# Patient Record
Sex: Female | Born: 1990 | Race: White | Hispanic: No | Marital: Single | Smoking: Never smoker
Health system: Southern US, Community
[De-identification: ages and names within clinical notes are randomized; demographics above are authoritative.]

## PROBLEM LIST (undated history)

## (undated) ENCOUNTER — Inpatient Hospital Stay (HOSPITAL_COMMUNITY): Payer: Self-pay

## (undated) DIAGNOSIS — K219 Gastro-esophageal reflux disease without esophagitis: Secondary | ICD-10-CM

## (undated) DIAGNOSIS — J45909 Unspecified asthma, uncomplicated: Secondary | ICD-10-CM

## (undated) HISTORY — PX: OTHER SURGICAL HISTORY: SHX169

## (undated) HISTORY — PX: TONSILLECTOMY: SUR1361

## (undated) HISTORY — PX: WISDOM TOOTH EXTRACTION: SHX21

---

## 2015-09-29 ENCOUNTER — Encounter (HOSPITAL_COMMUNITY): Payer: Self-pay | Admitting: *Deleted

## 2015-09-29 ENCOUNTER — Inpatient Hospital Stay (HOSPITAL_COMMUNITY)
Admission: AD | Admit: 2015-09-29 | Discharge: 2015-09-29 | Disposition: A | Discharge status: Home / Self Care | Payer: Medicaid Other | Source: Ambulatory Visit | Attending: Obstetrics & Gynecology | Admitting: Obstetrics & Gynecology

## 2015-09-29 ENCOUNTER — Inpatient Hospital Stay (HOSPITAL_COMMUNITY): Payer: Medicaid Other

## 2015-09-29 DIAGNOSIS — R102 Pelvic and perineal pain: Secondary | ICD-10-CM | POA: Insufficient documentation

## 2015-09-29 DIAGNOSIS — O34219 Maternal care for unspecified type scar from previous cesarean delivery: Secondary | ICD-10-CM

## 2015-09-29 DIAGNOSIS — O26893 Other specified pregnancy related conditions, third trimester: Secondary | ICD-10-CM | POA: Diagnosis not present

## 2015-09-29 DIAGNOSIS — Z3A30 30 weeks gestation of pregnancy: Secondary | ICD-10-CM | POA: Insufficient documentation

## 2015-09-29 HISTORY — DX: Unspecified asthma, uncomplicated: J45.909

## 2015-09-29 HISTORY — DX: Gastro-esophageal reflux disease without esophagitis: K21.9

## 2015-09-29 NOTE — MAU Note (Signed)
Pt presents to MAU stating that she is suppose to meet Dr Despina HiddenEure here. PT states that she delivered her last baby three years ago in [place] by C/S. PT states at her last appointment that her physician told her that her C/S scar was thin and her needed her to be transferred to a high risk facility.

## 2015-09-29 NOTE — Progress Notes (Signed)
Pt is stating that Dr. Despina HiddenEure instructed her to come to MAU @ 1830 tonight so she could establish care with him.  Dr. Despina HiddenEure states that a family member of the patient contacted him last night, stated that her OB in [place] is uncomfortable with her situation & the pt's family member wanted to know if he would take her as a patient.  MD states he instructed the family member to have the patient fax her records to his office & he would see her.  MD is unaware that patient was coming to MAU today.

## 2015-09-29 NOTE — MAU Note (Signed)
Urine sent to lab 

## 2015-09-29 NOTE — MAU Provider Note (Addendum)
  History     CSN: [id number]  Arrival date and time: 09/29/15 [id number]   First Provider Initiated Contact with Patient 09/29/15 1943       HPI  [id number] Estimated Date of Delivery: 12/02/15 followed in [place]  Pt presents for evaluation of possible lower uterine segment thinning or a "window" in the lower uterine segment which was seen on sonogram 2 weeks ago at her local OB group in [place].  Subsequently pt was [facility] but was having difficulty with logisitics and appointments. She has been on bedrest since that sonogram and information for fear of uterine disruption.  She came in tonight because she was experiencing lower pelvic pain adn pressure.  No bleeding good fetal movement no ROM  Reactive NST and no contractions see on the monitor and pt began to feel better as well  Sonogram was performed and appears normal with no evidence of thinning or lower segment disruption, the uterine wall thickness was consistent throughout and no fluid or small parts or any abnormalities was noted today in the uterine/lower segment wall. I reviewed the images myself with the ultrasonographer   Past Medical History  Diagnosis Date  . Asthma   . GERD (gastroesophageal reflux disease)     Past Surgical History  Procedure Laterality Date  . Tonsillectomy    . Addenoidectomy    . Wisdom tooth extraction    . Cesarean section      History reviewed. No pertinent family history.  Social History  Substance Use Topics  . Smoking status: Never Smoker   . Smokeless tobacco: None  . Alcohol Use: No    Allergies: No Known Allergies  No prescriptions prior to admission    ROS Physical Exam   Blood pressure 125/50, pulse 72, temperature 99.5 F (37.5 C), temperature source Oral, resp. rate 16, height 5\' 1"  (1.549 m), weight 256 lb (116.121 kg), last menstrual period 02/25/2015.  Physical Exam  Abdomen soft normal size for GA, benign non  tender Extremities no edema Pelvic deferred  MAU Course  Procedures  MDM   Assessment and Plan  3176w6d Estimated Date of Delivery: 12/02/15 with no evidence tonight of disruption of lower uterine segment or "windowing" Fetal status reassuring  Pt instructed she is welcome to continue care in [place] or [facility], wherever she chooses is appropriate, she understands and will consider her options  EURE,LUTHER H 09/29/2015, 9:31 PM

## 2016-08-03 ENCOUNTER — Encounter (HOSPITAL_COMMUNITY): Payer: Self-pay

## 2017-12-07 IMAGING — US US MFM OB COMP +14 WKS
1 series · 13 of 28 positions shown · non-contrast
Comparison: none

MAU/Triage

1  GEOK HUA BENG              [id number]      [id number]     [id number]
Indications
30 weeks gestation of pregnancy
Previous cesarean delivery, antepartum
Obesity complicating pregnancy, third
trimester
OB History
Gravidity:    2         Term:   1
Living:       1
Fetal Evaluation
Num Of Fetuses:     1
Fetal Heart         126
Rate(bpm):
Cardiac Activity:   Observed
Presentation:       Cephalic
Placenta:           Anterior, above cervical os
P. Cord Insertion:  Visualized
Amniotic Fluid
AFI FV:      Subjectively within normal limits
AFI Sum(cm)     %Tile       Largest Pocket(cm)
12.45           34
RUQ(cm)       RLQ(cm)       LUQ(cm)        LLQ(cm)
2.26
Biometry
BPD:        76  mm     G. Age:  30w 4d         27  %    CI:        67.19   %   70 - 86
FL/HC:      19.6   %   19.3 -
HC:      296.9  mm     G. Age:  32w 6d         69  %    HC/AC:      1.04       0.96 -
AC:      285.2  mm     G. Age:  32w 4d         89  %    FL/BPD:     76.7   %   71 - 87
FL:       58.3  mm     G. Age:  30w 3d         25  %    FL/AC:      20.4   %   20 - 24
HUM:      50.7  mm     G. Age:  29w 5d         27  %
Est. FW:    7868  gm      4 lb 1 oz     73  %
Gestational Age
LMP:           30w 6d       Date:   02/25/15                 EDD:   12/02/15
U/S Today:     31w 4d                                        EDD:   11/27/15
Best:          30w 6d    Det. By:   LMP  (02/25/15)          EDD:   12/02/15
Anatomy
Cranium:               Appears normal         Aortic Arch:            Not well visualized
Cavum:                 Appears normal         Ductal Arch:            Not well visualized
Ventricles:            Appears normal         Diaphragm:              Appears normal
Choroid Plexus:        Not well visualized    Stomach:                Appears normal, left
sided
Cerebellum:            Appears normal         Abdomen:                Appears normal
Posterior Fossa:       Appears normal         Abdominal Wall:         Not well visualized
Nuchal Fold:           Not applicable (>20    Cord Vessels:           Appears normal (3
wks GA)                                        vessel cord)
Face:                  Appears normal         Kidneys:                Appear normal
(orbits and profile)
Lips:                  Appears normal         Bladder:                Appears normal
Thoracic:              Appears normal         Spine:                  Appears normal
Heart:                 Not well visualized    Upper Extremities:      Visualized
RVOT:                  Not well visualized    Lower Extremities:      Visualized
LVOT:                  Not well visualized
Other:  Left heel visualized. Technically difficult due to maternal habitus and
fetal position.
Cervix Uterus Adnexa
Cervix
Not visualized (advanced GA >06wks)
Uterus
No abnormality visualized.
Left Ovary
No adnexal mass visualized.
Right Ovary
Cul De Sac:   No free fluid seen.
Adnexa:       No abnormality visualized.
Impression
INDICATION: 24 yr old [id number] at 12w0d with previous C
section now with abdominal pain for fetal ultrasound. Remote
read.

[Series 1: us mfm ob comp +14 wks · 13 of 96 slices shown]
[im 4/96]
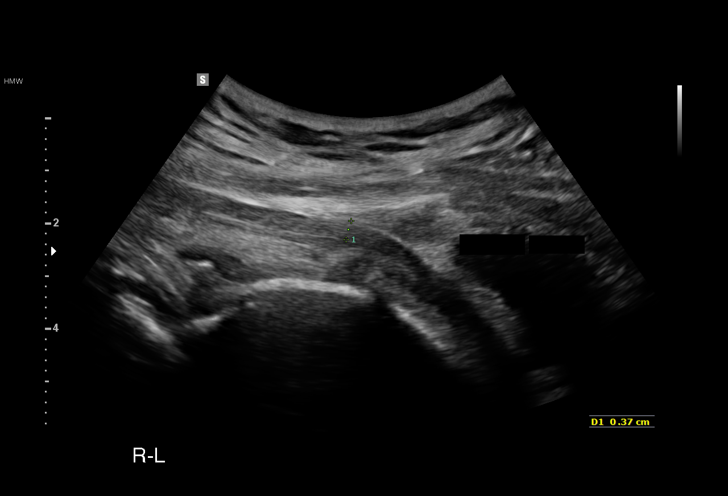
[im 11/96]
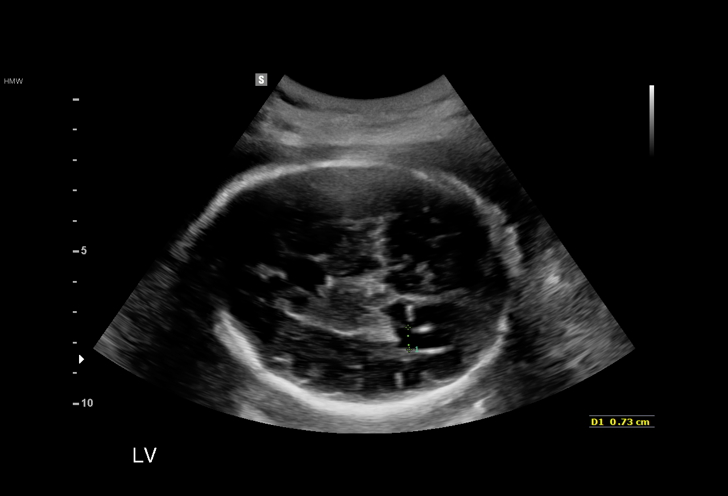
[im 18/96]
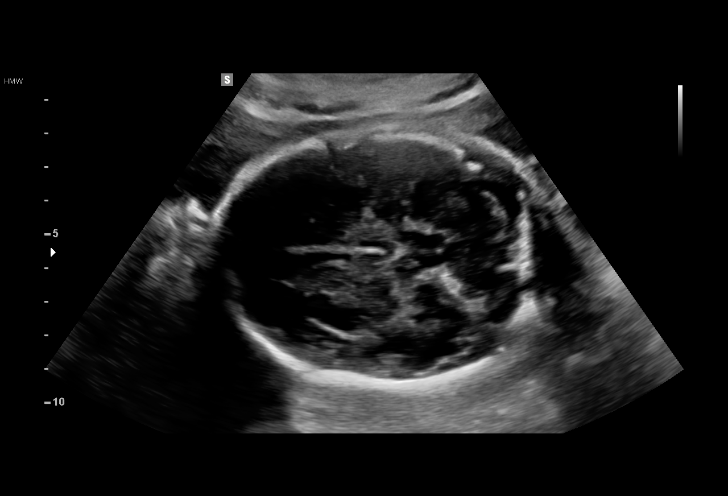
[im 25/96]
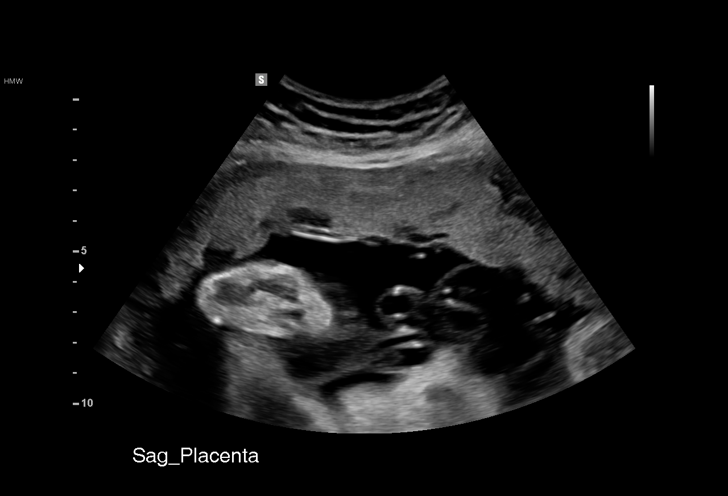
[im 32/96]
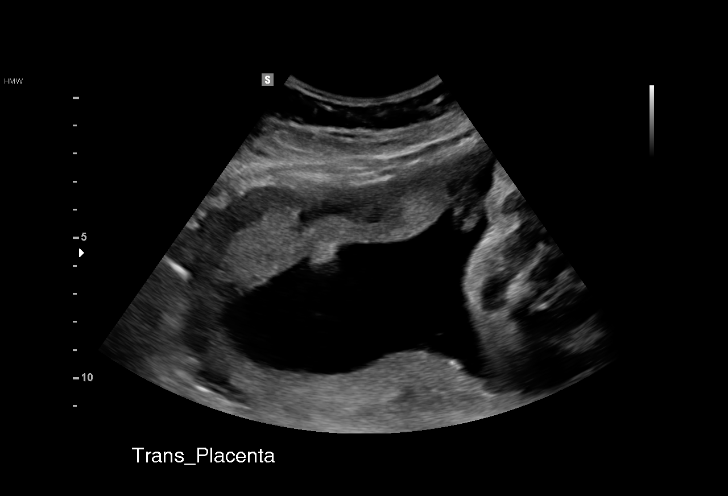
[im 39/96]
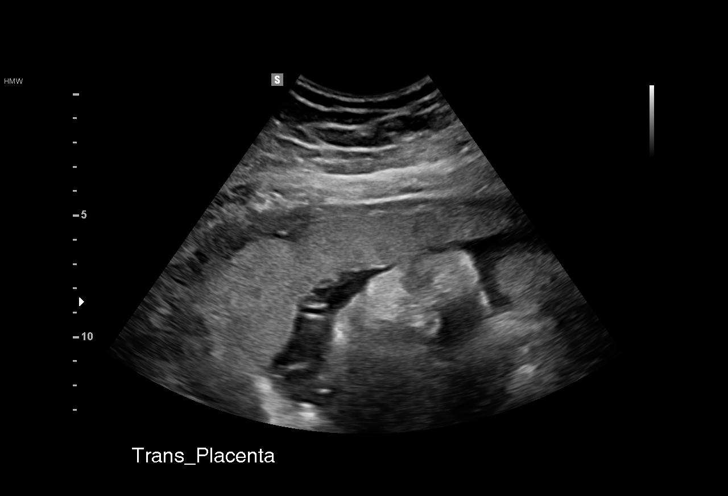
[im 50/96]
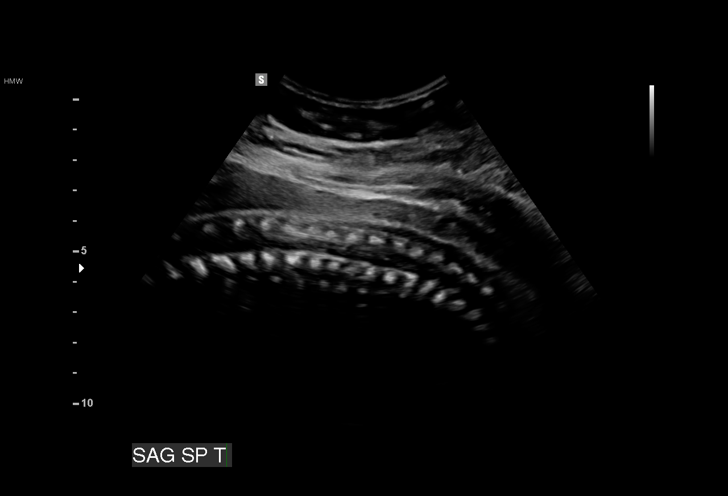
[im 57/96]
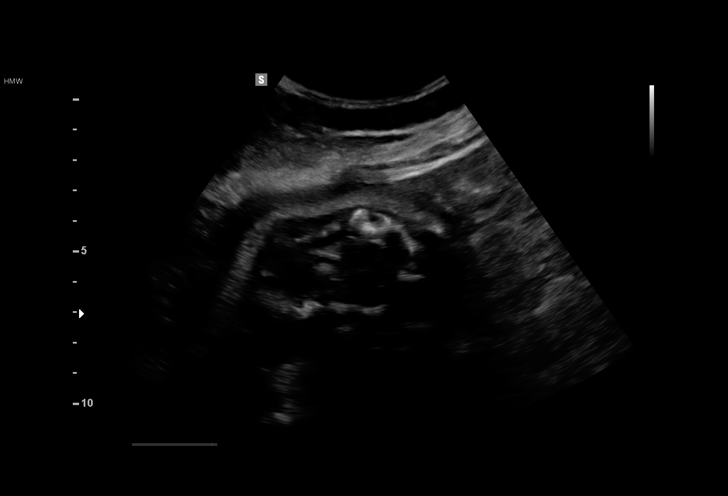
[im 64/96]
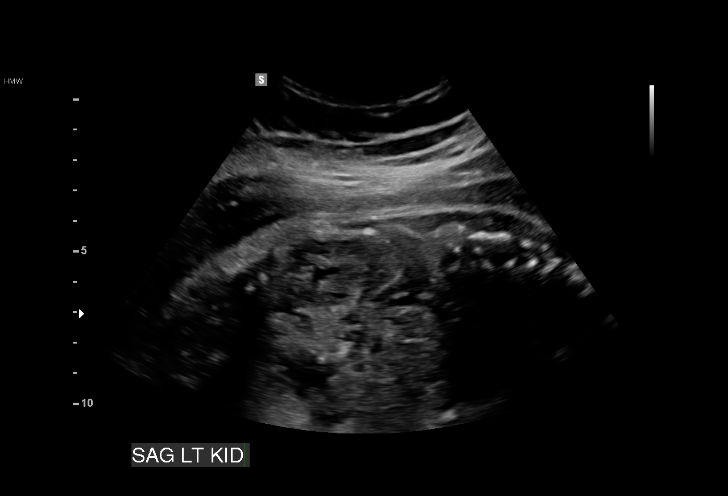
[im 71/96]
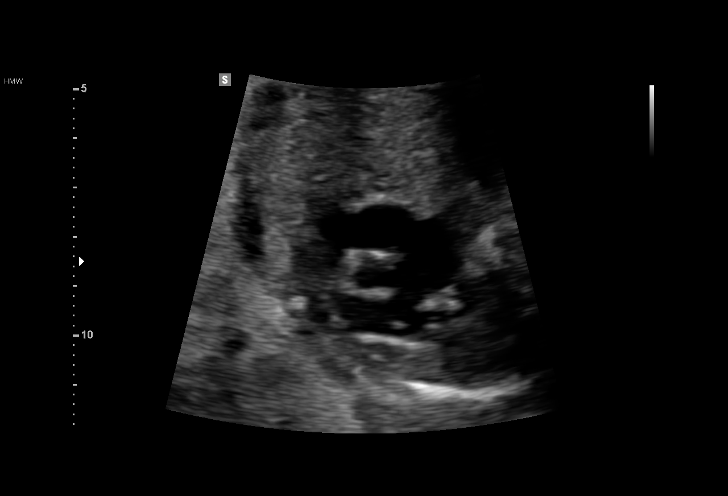
[im 78/96]
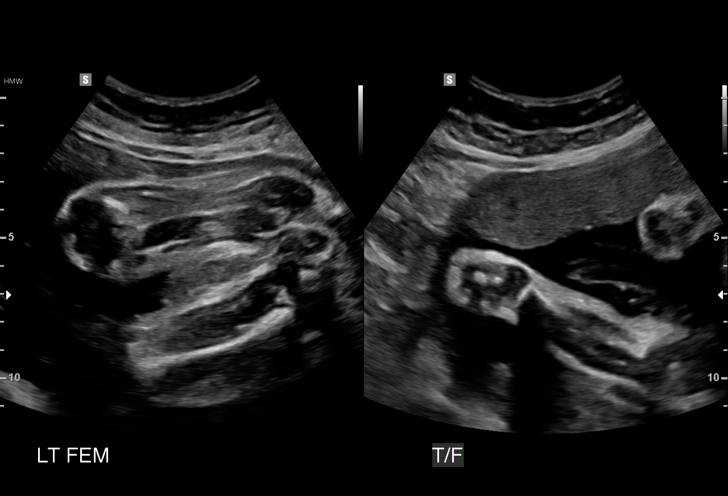
[im 85/96]
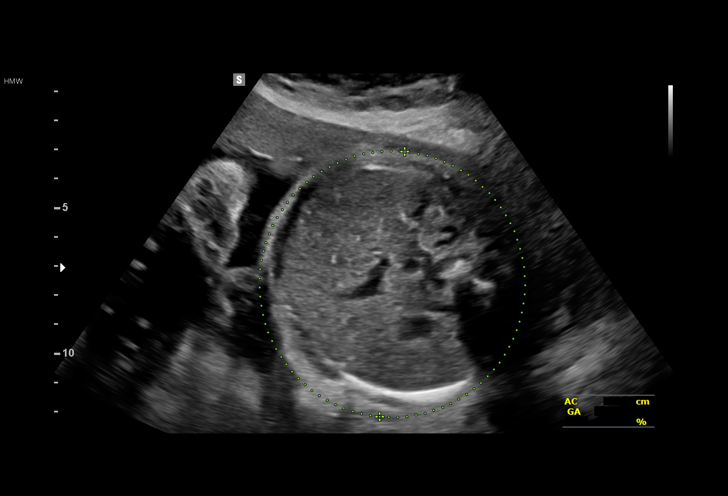
[im 92/96]
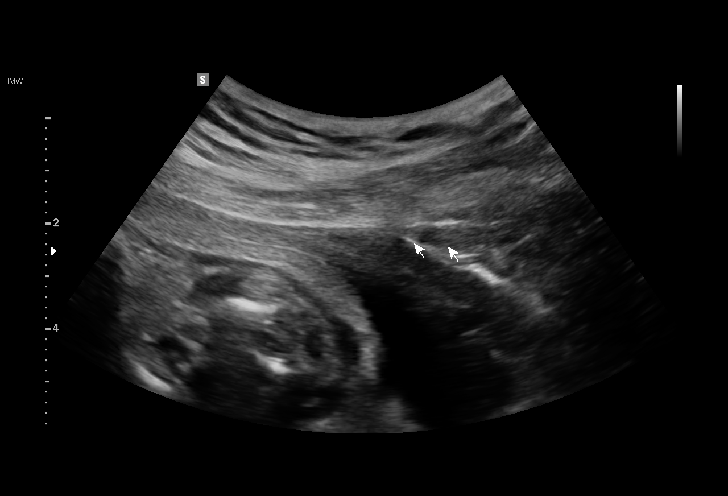

[13 of 28 positions shown; findings below may reference images not displayed]

FINDINGS: 1. Single intrauterine pregnancy.
2. Estimated fetal weight is in the 73rd%.
3. Anterior placenta without evidence of previa.
4. Normal amniotic fluid index.
5. The anatomy survey is limited as above; no abnormalities
seen.
6. The lower uterine segment is clear and no defects are
seen.
Recommendations

1. Appropriate fetal growth.
2. Normal limited anatomy survey.
3. Per chart there was concern about thinning of lower uterine
segment at level of hysterotomy scar:
- no defects seen on ultrasound today; however would
recommend live scanning by MFM (this is a remote read) to
evaluate the lower uterine segment
